# Patient Record
Sex: Male | Born: 1955 | Race: White | Hispanic: No | Marital: Single
Health system: Southern US, Community
[De-identification: ages and names within clinical notes are randomized; demographics above are authoritative.]

---

## 2014-01-17 ENCOUNTER — Other Ambulatory Visit (HOSPITAL_COMMUNITY)
Admission: RE | Admit: 2014-01-17 | Disposition: A | Discharge status: Home / Self Care | Payer: Medicaid Other | Source: Ambulatory Visit | Attending: Oncology | Admitting: Oncology

## 2016-01-27 ENCOUNTER — Other Ambulatory Visit (HOSPITAL_COMMUNITY): Payer: Medicaid Other

## 2016-01-27 ENCOUNTER — Inpatient Hospital Stay
Admission: AD | Admit: 2016-01-27 | Discharge: 2016-02-05 | Disposition: A | Discharge status: Skilled Nursing Facility | Payer: Medicaid Other | Source: Ambulatory Visit | Attending: Internal Medicine | Admitting: Internal Medicine

## 2016-01-27 DIAGNOSIS — Z4659 Encounter for fitting and adjustment of other gastrointestinal appliance and device: Secondary | ICD-10-CM

## 2016-01-27 DIAGNOSIS — R1310 Dysphagia, unspecified: Secondary | ICD-10-CM

## 2016-01-27 DIAGNOSIS — J9690 Respiratory failure, unspecified, unspecified whether with hypoxia or hypercapnia: Secondary | ICD-10-CM

## 2016-01-27 LAB — BASIC METABOLIC PANEL
ANION GAP: 11 (ref 5–15)
BUN: 14 mg/dL (ref 6–20)
CO2: 26 mmol/L (ref 22–32)
Calcium: 9.2 mg/dL (ref 8.9–10.3)
Chloride: 99 mmol/L — ABNORMAL LOW (ref 101–111)
Creatinine, Ser: 0.87 mg/dL (ref 0.61–1.24)
GFR calc Af Amer: >60 mL/min (ref >60)
Glucose, Bld: 139 mg/dL — ABNORMAL HIGH (ref 65–99)
Potassium: 4.6 mmol/L (ref 3.5–5.1)
SODIUM: 136 mmol/L (ref 135–145)

## 2016-01-27 LAB — CBC
HCT: 31.9 % — ABNORMAL LOW (ref 39.0–52.0)
Hemoglobin: 10.1 g/dL — ABNORMAL LOW (ref 13.0–17.0)
MCH: 31.4 pg (ref 26.0–34.0)
MCHC: 31.7 g/dL (ref 30.0–36.0)
MCV: 99.1 fL (ref 78.0–100.0)
PLATELETS: 516 10*3/uL — AB (ref 150–400)
RBC: 3.22 MIL/uL — AB (ref 4.22–5.81)
RDW: 14.8 % (ref 11.5–15.5)
WBC: 11.6 10*3/uL — AB (ref 4.0–10.5)

## 2016-01-28 ENCOUNTER — Other Ambulatory Visit (HOSPITAL_COMMUNITY): Payer: Medicaid Other

## 2016-01-28 LAB — APTT: aPTT: 28 seconds (ref 24–37)

## 2016-01-28 LAB — PROTIME-INR
INR: 1.08 (ref 0.00–1.49)
Prothrombin Time: 14.2 seconds (ref 11.6–15.2)

## 2016-01-28 NOTE — Consult Note (Signed)
Chief Complaint: Patient was seen in consultation today for percutaneous gastric tube placement at the request of Dr Sharyon Medicus  Referring Physician(s): [facility]  Supervising Physician 01/28/2016 Billy Wiley  History of Present Illness: Billy Wiley is a 60 y.o. 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  No past surgical history on file.  Allergies: Review of patient's allergies indicates not on file.  Medications: Prior to Admission medications   Not on File     No family history on file.  Social History   Social History  . Marital Status: Unknown    Spouse Name: N/A  . Number of Children: N/A  . Years of Education: N/A   Social History Main Topics  . Smoking status: Not on file  . Smokeless tobacco: Not on file  . Alcohol Use: Not on file  . Drug Use: Not on file  . Sexual Activity: Not on file   Other Topics Concern  . Not on file   Social History Narrative  . No narrative on file     Review of Systems: A 12 point ROS discussed and pertinent positives are indicated in the HPI above.  All other systems are negative.  Review of Systems  Constitutional: Positive for activity change and fatigue. Negative for fever and appetite change.  Respiratory: Negative for shortness of breath.   Gastrointestinal: Negative for abdominal pain.  Neurological: Positive for weakness.  Psychiatric/Behavioral: Positive for confusion.    Vital Signs: There were no vitals taken for this visit.  Physical Exam  Cardiovascular: Normal rate and regular rhythm.   Pulmonary/Chest: Effort normal and breath sounds normal.  Abdominal: Soft. Bowel sounds are normal.  Musculoskeletal: Normal  range of motion.  Neurological: He is alert.  Skin: Skin is warm and dry.  Psychiatric:  Consented dtr over phone  Nursing note and vitals reviewed.   Mallampati Score:  MD Evaluation Airway: WNL Heart: WNL Abdomen: WNL Chest/ Lungs: WNL ASA  Classification: 3 Mallampati/Airway Score: One  Imaging: Ct Abdomen Wo Contrast  01/27/2016  CLINICAL DATA:  Gastrostomy tube evaluation. EXAM: CT ABDOMEN WITHOUT CONTRAST TECHNIQUE: Multidetector CT imaging of the abdomen was performed following the standard protocol without IV contrast. COMPARISON:  None. FINDINGS: Lower chest:  No acute findings. Hepatobiliary: No mass visualized on this un-enhanced exam. Tiny calcified gallstones noted, however there is no evidence cholecystitis or biliary ductal dilatation. Pancreas: No mass or inflammatory process identified on this un-enhanced exam. Spleen: Within normal limits in size. Adrenals/Urinary Tract: Adrenal glands are unremarkable. Tiny 1-2 mm nonobstructive calculi noted in both kidneys. No evidence of hydronephrosis. Stomach/Bowel: No evidence of obstruction, inflammatory process, or abnormal fluid collections. No evidence of interposition of transverse colon anterior to the stomach. Vascular/Lymphatic: No pathologically enlarged lymph nodes. No evidence of abdominal aortic aneurysm. Aortic atherosclerotic plaque noted. Other: A tiny periumbilical hernia seen containing only fat. This is incompletely visualized on this exam. Musculoskeletal:  No suspicious bone lesions identified. IMPRESSION: No evidence of colonic interposition anterior to stomach. Cholelithiasis.  No radiographic evidence of cholecystitis. Tiny nonobstructive bilateral renal calculi. Electronically Signed   By: Myles Rosenthal M.D.   On: 01/27/2016 18:03   Dg Abd 1 View  01/27/2016  CLINICAL DATA:  Feeding tube placement. EXAM: ABDOMEN - 1 VIEW COMPARISON:  None. FINDINGS: Feeding tube is in place with the tip in the distal stomach  directed toward the duodenum. IMPRESSION: As above. Electronically Signed   By: Drusilla Kanner M.D.   On: 01/27/2016 19:57   Dg Chest Port 1 View  01/28/2016  CLINICAL DATA:  CHF and sepsis.  Respiratory failure EXAM: PORTABLE CHEST 1 VIEW COMPARISON:  None. FINDINGS: COPD with hyperinflation of the lungs. Apical scarring and bleb formation on the right. Negative for heart failure. Negative for pneumonia. Mild right lower lobe atelectasis. No pleural effusion. Feeding tube enters the stomach with the tip not visualized. IMPRESSION: COPD with slight right lower lobe atelectasis. Otherwise no acute abnormality. Electronically Signed   By: Marlan Palau M.D.   On: 01/28/2016 07:21   Dg Abd Portable 1v  01/27/2016  CLINICAL DATA:  Nasogastric tube placement. EXAM: PORTABLE ABDOMEN - 1 VIEW COMPARISON:  None. FINDINGS: Tip and side port of the enteric tube below the diaphragm in the stomach. No evidence of bowel obstruction. There is air throughout normal caliber colon. IMPRESSION: Tip and side port of the enteric tube below the diaphragm in the stomach. Electronically Signed   By: Rubye Oaks M.D.   On: 01/27/2016 02:14    Labs:  CBC:  Recent Labs  01/27/16 0901  WBC 11.6*  HGB 10.1*  HCT 31.9*  PLT 516*    COAGS:  Recent Labs  01/28/16 0952  INR 1.08  APTT 28    BMP:  Recent Labs  01/27/16 0901  NA 136  K 4.6  CL 99*  CO2 26  GLUCOSE 139*  BUN 14  CALCIUM 9.2  CREATININE 0.87  GFRNONAA >60  GFRAA >60    LIVER FUNCTION TESTS: No results for input(s): BILITOT, AST, ALT, ALKPHOS, PROT, ALBUMIN in the last 8760 hours.  TUMOR MARKERS: No results for input(s): AFPTM, CEA, CA199, CHROMGRNA in the last 8760 hours.  Assessment and Plan:  Parkinsons dz Aspiration PCM; dysphagia AMS Need for long term care Scheduled for percutaneous gastric tube placement in IR 2/24 Risks and Benefits discussed with the patient's daughter including, but not limited to the need  for a barium enema during the procedure, bleeding, infection, peritonitis, or damage to adjacent structures. All of her questions were answered, she is agreeable to proceed. Consent signed and in chart.   Thank you for this interesting consult.  I greatly enjoyed meeting Billy Wiley and look forward to participating in their care.  A copy of this report was sent to the requesting provider on this date.  Electronically Signed: Ralene Muskrat A 01/28/2016, 3:52 PM   I spent a total of 40 Minutes    in face to face in clinical consultation, greater than 50% of which was counseling/coordinating care for percutaneous gastric tube placement

## 2016-01-29 ENCOUNTER — Other Ambulatory Visit (HOSPITAL_COMMUNITY): Payer: Medicaid Other

## 2016-01-29 MED ORDER — SODIUM CHLORIDE 0.9 % IV SOLN
INTRAVENOUS | Status: AC | PRN
  Administered 2016-01-29: 10 mL/h via INTRAVENOUS

## 2016-01-29 MED ORDER — FENTANYL CITRATE (PF) 100 MCG/2ML IJ SOLN
INTRAMUSCULAR | Status: AC
  Filled 2016-01-29: qty 2

## 2016-01-29 MED ORDER — IOHEXOL 300 MG/ML  SOLN
50.0000 mL | Freq: Once | INTRAMUSCULAR | Status: AC | PRN
  Administered 2016-01-29: 50 mL via INTRAVENOUS

## 2016-01-29 MED ORDER — LIDOCAINE HCL 1 % IJ SOLN
INTRAMUSCULAR | Status: AC
  Filled 2016-01-29: qty 20

## 2016-01-29 MED ORDER — FENTANYL CITRATE (PF) 100 MCG/2ML IJ SOLN
INTRAMUSCULAR | Status: AC | PRN
  Administered 2016-01-29: 50 ug via INTRAVENOUS
  Administered 2016-01-29: 25 ug via INTRAVENOUS

## 2016-01-29 MED ORDER — GLUCAGON HCL (RDNA) 1 MG IJ SOLR
INTRAMUSCULAR | Status: AC | PRN
  Administered 2016-01-29: 1 mg via INTRAVENOUS

## 2016-01-29 MED ORDER — CEFAZOLIN SODIUM-DEXTROSE 2-3 GM-% IV SOLR
2.0000 g | Freq: Once | INTRAVENOUS | Status: AC
  Administered 2016-01-29: 2 g via INTRAVENOUS

## 2016-01-29 MED ORDER — GLUCAGON HCL RDNA (DIAGNOSTIC) 1 MG IJ SOLR
INTRAMUSCULAR | Status: AC
  Filled 2016-01-29: qty 1

## 2016-01-29 MED ORDER — MIDAZOLAM HCL 2 MG/2ML IJ SOLN
INTRAMUSCULAR | Status: AC
  Filled 2016-01-29: qty 2

## 2016-01-29 MED ORDER — CEFAZOLIN SODIUM-DEXTROSE 2-3 GM-% IV SOLR
INTRAVENOUS | Status: AC
  Administered 2016-01-29: 2 g via INTRAVENOUS
  Filled 2016-01-29: qty 50

## 2016-01-29 MED ORDER — MIDAZOLAM HCL 2 MG/2ML IJ SOLN
INTRAMUSCULAR | Status: AC | PRN
  Administered 2016-01-29: 0.5 mg via INTRAVENOUS
  Administered 2016-01-29: 1 mg via INTRAVENOUS

## 2016-01-29 NOTE — Procedures (Signed)
G tube 20 Fr pull through No comp/EBL 

## 2016-02-01 LAB — CBC WITH DIFFERENTIAL/PLATELET
BASOS ABS: 0 10*3/uL (ref 0.0–0.1)
BASOS PCT: 0 %
Eosinophils Absolute: 0.1 10*3/uL (ref 0.0–0.7)
Eosinophils Relative: 1 %
HEMATOCRIT: 34.3 % — AB (ref 39.0–52.0)
Hemoglobin: 11.4 g/dL — ABNORMAL LOW (ref 13.0–17.0)
Lymphocytes Relative: 40 %
Lymphs Abs: 4 10*3/uL (ref 0.7–4.0)
MCH: 32.8 pg (ref 26.0–34.0)
MCHC: 33.2 g/dL (ref 30.0–36.0)
MCV: 98.6 fL (ref 78.0–100.0)
MONO ABS: 1.5 10*3/uL — AB (ref 0.1–1.0)
Monocytes Relative: 15 %
NEUTROS ABS: 4.3 10*3/uL (ref 1.7–7.7)
NEUTROS PCT: 44 %
Platelets: 490 10*3/uL — ABNORMAL HIGH (ref 150–400)
RBC: 3.48 MIL/uL — AB (ref 4.22–5.81)
RDW: 14.5 % (ref 11.5–15.5)
WBC: 9.9 10*3/uL (ref 4.0–10.5)

## 2016-02-01 LAB — BASIC METABOLIC PANEL
ANION GAP: 11 (ref 5–15)
BUN: 14 mg/dL (ref 6–20)
CALCIUM: 9.2 mg/dL (ref 8.9–10.3)
CO2: 27 mmol/L (ref 22–32)
Chloride: 101 mmol/L (ref 101–111)
Creatinine, Ser: 0.85 mg/dL (ref 0.61–1.24)
Glucose, Bld: 152 mg/dL — ABNORMAL HIGH (ref 65–99)
Potassium: 3.8 mmol/L (ref 3.5–5.1)
Sodium: 139 mmol/L (ref 135–145)

## 2016-02-01 LAB — MAGNESIUM: Magnesium: 1.6 mg/dL — ABNORMAL LOW (ref 1.7–2.4)

## 2016-02-01 LAB — PHOSPHORUS: PHOSPHORUS: 4.5 mg/dL (ref 2.5–4.6)

## 2016-02-03 ENCOUNTER — Other Ambulatory Visit (HOSPITAL_COMMUNITY): Payer: Medicaid Other

## 2016-02-03 LAB — C DIFFICILE QUICK SCREEN W PCR REFLEX
C Diff antigen: NEGATIVE
C Diff interpretation: NEGATIVE
C Diff toxin: NEGATIVE

## 2016-03-07 ENCOUNTER — Other Ambulatory Visit (HOSPITAL_COMMUNITY): Payer: Self-pay | Admitting: Adult Health

## 2016-03-07 DIAGNOSIS — R1310 Dysphagia, unspecified: Secondary | ICD-10-CM

## 2016-03-09 ENCOUNTER — Inpatient Hospital Stay (HOSPITAL_COMMUNITY): Admission: RE | Admit: 2016-03-09 | Payer: Medicaid Other | Source: Ambulatory Visit

## 2016-03-29 ENCOUNTER — Inpatient Hospital Stay (HOSPITAL_COMMUNITY): Admission: RE | Admit: 2016-03-29 | Payer: Medicaid Other | Source: Ambulatory Visit

## 2017-05-03 ENCOUNTER — Other Ambulatory Visit (HOSPITAL_COMMUNITY): Payer: Self-pay | Admitting: Radiology

## 2017-05-03 DIAGNOSIS — R633 Feeding difficulties, unspecified: Secondary | ICD-10-CM

## 2017-05-04 ENCOUNTER — Encounter (HOSPITAL_COMMUNITY): Payer: Self-pay | Admitting: Diagnostic Radiology

## 2017-05-04 ENCOUNTER — Ambulatory Visit (HOSPITAL_COMMUNITY)
Admission: RE | Admit: 2017-05-04 | Discharge: 2017-05-04 | Disposition: A | Discharge status: Home / Self Care | Payer: Medicare Other | Source: Ambulatory Visit | Attending: Radiology | Admitting: Radiology

## 2017-05-04 ENCOUNTER — Other Ambulatory Visit (HOSPITAL_COMMUNITY): Payer: Self-pay | Admitting: Radiology

## 2017-05-04 DIAGNOSIS — R633 Feeding difficulties, unspecified: Secondary | ICD-10-CM

## 2017-05-04 DIAGNOSIS — K9423 Gastrostomy malfunction: Secondary | ICD-10-CM | POA: Insufficient documentation

## 2017-05-04 HISTORY — PX: IR CM INJ ANY COLONIC TUBE W/FLUORO: IMG2336

## 2017-05-04 MED ORDER — LIDOCAINE VISCOUS 2 % MT SOLN
OROMUCOSAL | Status: AC
  Filled 2017-05-04: qty 15

## 2017-05-04 MED ORDER — IOPAMIDOL (ISOVUE-300) INJECTION 61%
INTRAVENOUS | Status: AC
  Filled 2017-05-04: qty 50

## 2017-05-04 MED ORDER — IOPAMIDOL (ISOVUE-300) INJECTION 61%
50.0000 mL | Freq: Once | INTRAVENOUS | Status: AC | PRN
  Administered 2017-05-04: 10 mL via INTRAVENOUS

## 2017-08-22 ENCOUNTER — Other Ambulatory Visit (HOSPITAL_COMMUNITY): Payer: Self-pay | Admitting: Internal Medicine

## 2017-08-22 DIAGNOSIS — R633 Feeding difficulties, unspecified: Secondary | ICD-10-CM

## 2017-08-24 ENCOUNTER — Ambulatory Visit (HOSPITAL_COMMUNITY)
Admission: RE | Admit: 2017-08-24 | Discharge: 2017-08-24 | Disposition: A | Discharge status: Home / Self Care | Payer: Medicare Other | Source: Ambulatory Visit | Attending: Internal Medicine | Admitting: Internal Medicine

## 2017-08-24 ENCOUNTER — Encounter (HOSPITAL_COMMUNITY): Payer: Self-pay | Admitting: Interventional Radiology

## 2017-08-24 ENCOUNTER — Ambulatory Visit (HOSPITAL_COMMUNITY)
Admission: RE | Admit: 2017-08-24 | Discharge: 2017-08-24 | Disposition: A | Discharge status: Home / Self Care | Payer: Medicare Other | Source: Ambulatory Visit | Attending: Interventional Radiology | Admitting: Interventional Radiology

## 2017-08-24 DIAGNOSIS — K9420 Gastrostomy complication, unspecified: Secondary | ICD-10-CM | POA: Diagnosis present

## 2017-08-24 DIAGNOSIS — M795 Residual foreign body in soft tissue: Secondary | ICD-10-CM

## 2017-08-24 DIAGNOSIS — R633 Feeding difficulties, unspecified: Secondary | ICD-10-CM

## 2017-08-24 DIAGNOSIS — R1319 Other dysphagia: Secondary | ICD-10-CM | POA: Diagnosis not present

## 2017-08-24 HISTORY — PX: IR REPLC GASTRO/COLONIC TUBE PERCUT W/FLUORO: IMG2333

## 2017-08-24 MED ORDER — IOPAMIDOL (ISOVUE-300) INJECTION 61%
INTRAVENOUS | Status: AC
  Filled 2017-08-24: qty 50

## 2017-08-24 MED ORDER — LIDOCAINE VISCOUS 2 % MT SOLN
OROMUCOSAL | Status: DC | PRN
  Administered 2017-08-24: 5 mL via OROMUCOSAL

## 2017-08-24 MED ORDER — LIDOCAINE VISCOUS 2 % MT SOLN
OROMUCOSAL | Status: AC
  Filled 2017-08-24: qty 15

## 2017-08-24 NOTE — Procedures (Signed)
Interventional Radiology Procedure Note  Procedure: Removal of damaged 28F pull-through g-tube.  Placement of new 28F balloon retention.    Findings:  Fracture of the damaged 28F pull-through, with retained fragment of the button.  Possible location within the body wall or outside stomach lumen.   .  Complications: None Recommendations:  - To CT for non-contrast CT abd - OK to use g-tube.  - Routine g-tube care    Signed,  Yvone Neu. Loreta Ave, DO

## 2017-10-13 IMAGING — XA IR GI TUBE CONTRAST INJECTION
1 series · 3 of 3 positions shown · non-contrast
Comparison: none

INDICATION: Leaking gastrostomy tube.

[Series 300: tube placements · 3 of 3 slices shown]
[im 1/3]
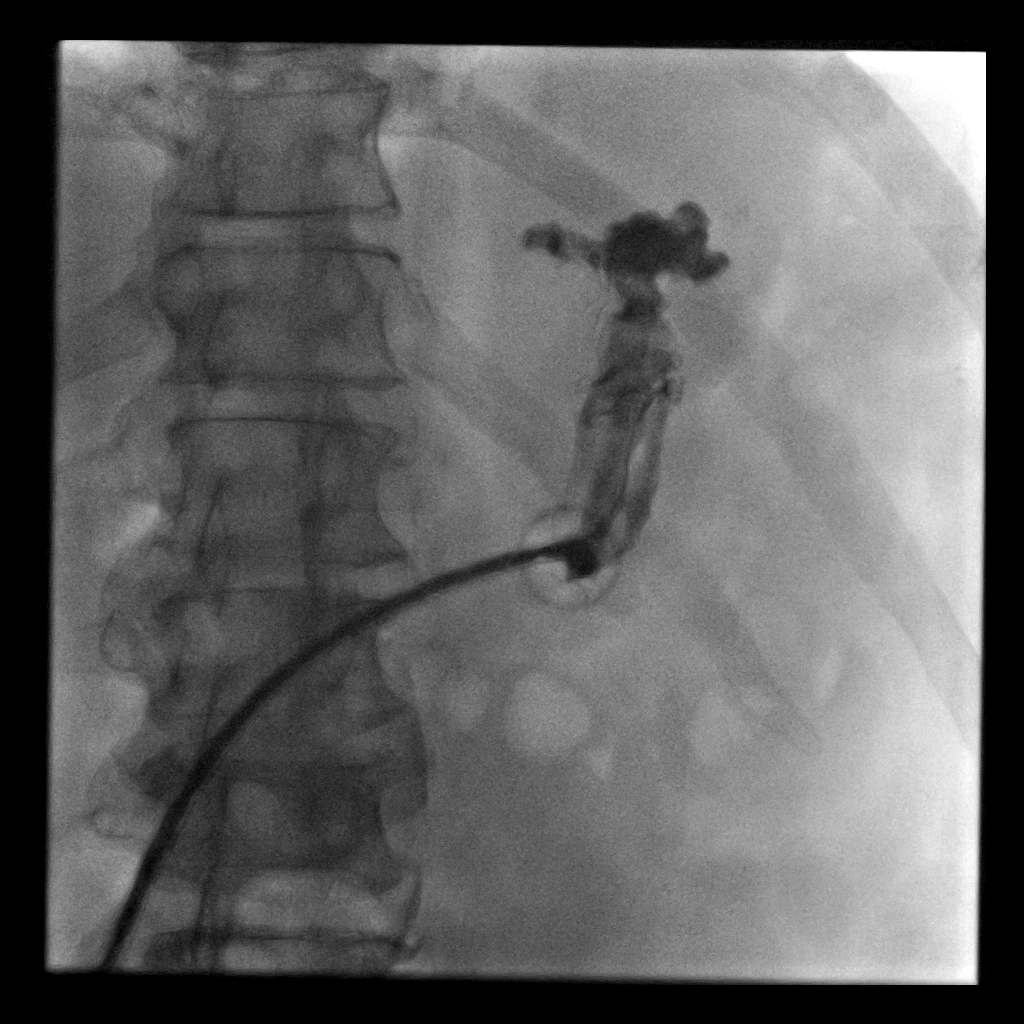
[im 2/3]
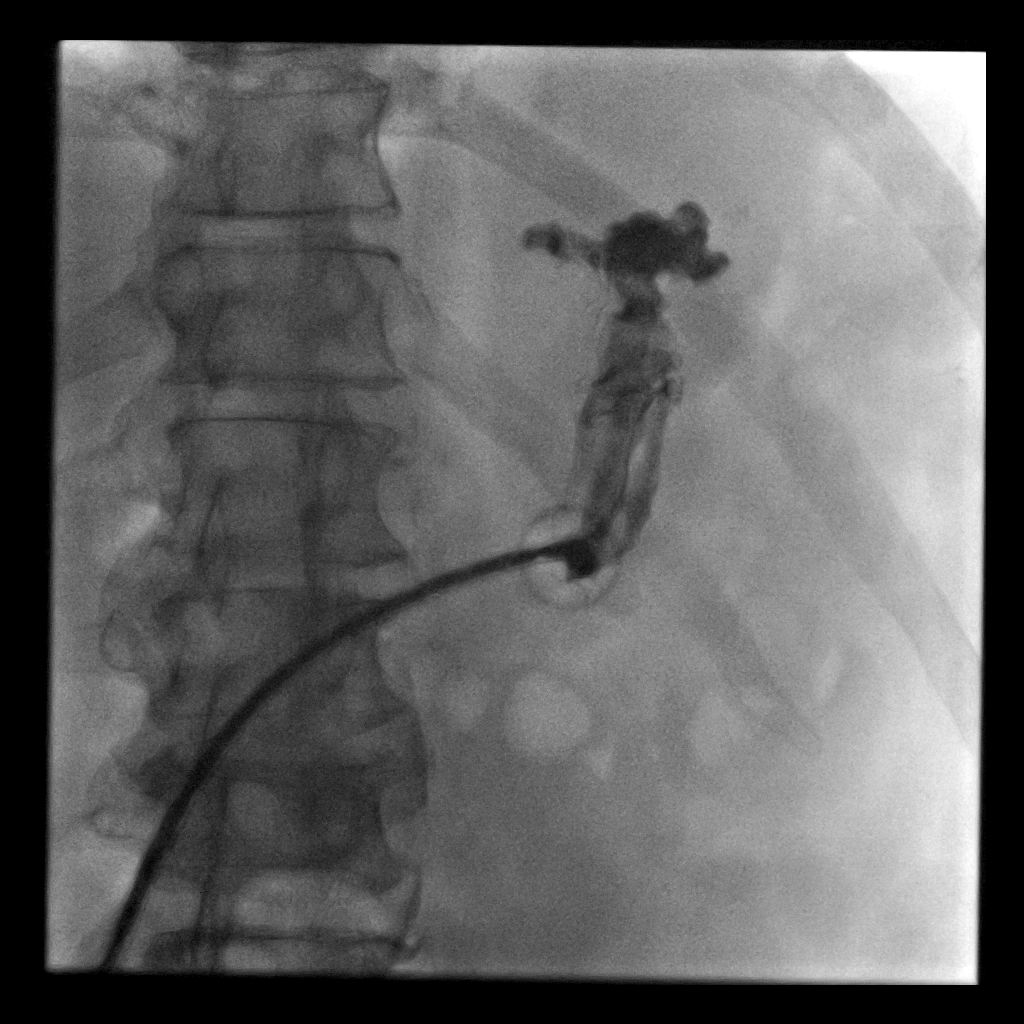
[im 3/3]
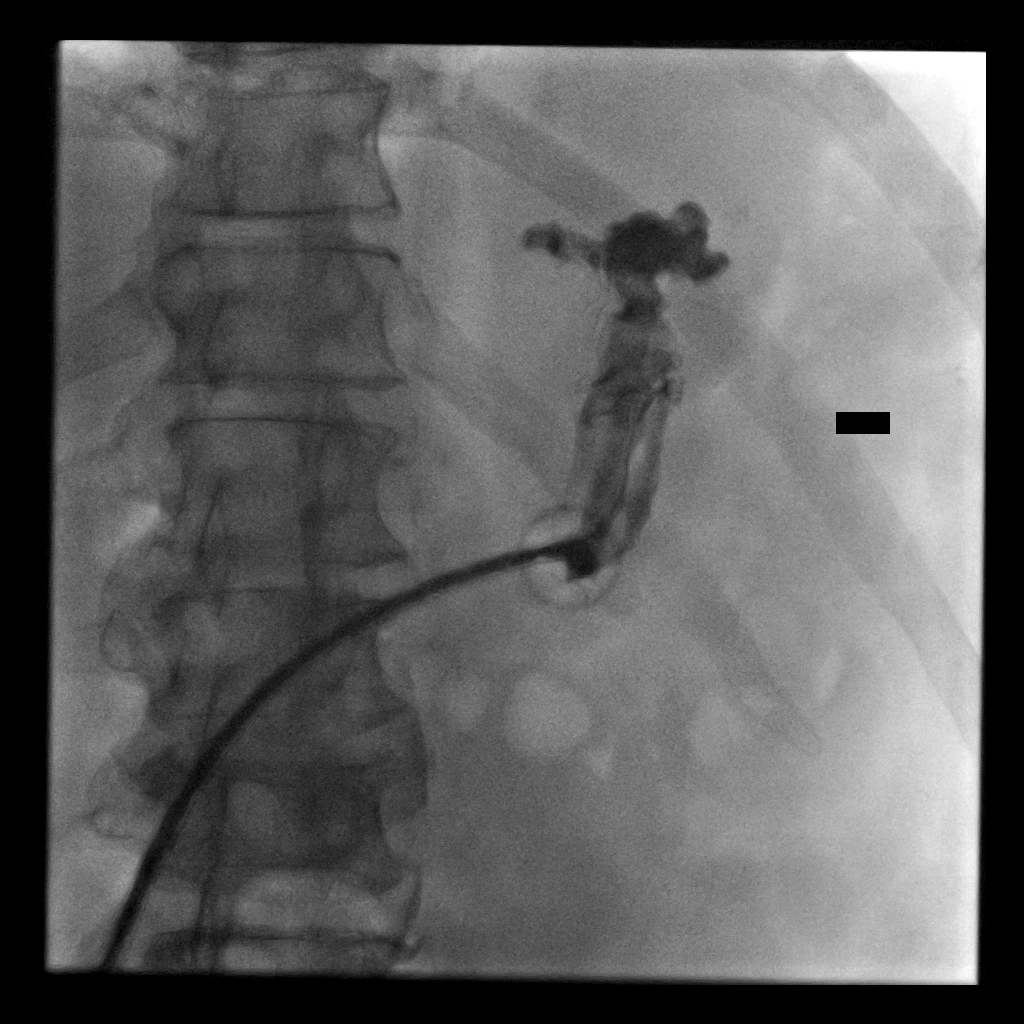

[3 of 3 positions shown; findings below may reference images not displayed]

EXAM:
GASTROSTOMY TUBE INJECTION WITH FLUOROSCOPY

MEDICATIONS:
None

ANESTHESIA/SEDATION:
None

CONTRAST:  10 mL [id number] - administered into the gastric lumen.

FLUOROSCOPY TIME:  Fluoroscopy Time: 6 seconds, 1 mGy

COMPLICATIONS:
None immediate.

PROCEDURE:
The gastrostomy tube was evaluated. The end of the tube was
fractured. The tube was cut at a normal segment of the tube. A new
adapter device was placed. The tube flushed without problems.
Contrast injection confirmed placement in the stomach.

Fluoroscopic images were taken and saved for this procedure.
IMPRESSION: Successful repair of the gastrostomy tube.

Gastrostomy tube is well positioned in the stomach. Gastrostomy tube
is ready to be used.

## 2018-02-02 IMAGING — CT CT ABDOMEN W/O CM
2 of 4 series · 16 of 46 positions shown, 18 images · non-contrast
Comparison: 01/27/2016

CLINICAL DATA: 60-year-old male with a history of percutaneous
gastrostomy tube. The gastrostomy has been recently exchanged, with
fracture of the catheter end retained foreign body. Concern for
retained foreign body within the abdomen.

EXAM:
CT ABDOMEN WITHOUT CONTRAST
TECHNIQUE: Multidetector CT imaging of the abdomen was performed following the
standard protocol without IV contrast.

[Series 3: a/p w/o 5mm · axial · non-contrast · 0.71mm/px · z∈[+1143,+1368]mm · 13 of 51 slices shown, 15 images]
[im 3/51  soft-tissue]
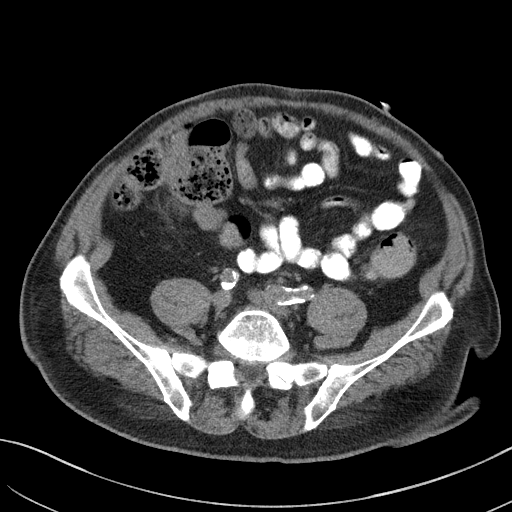
[im 3/51  bone]
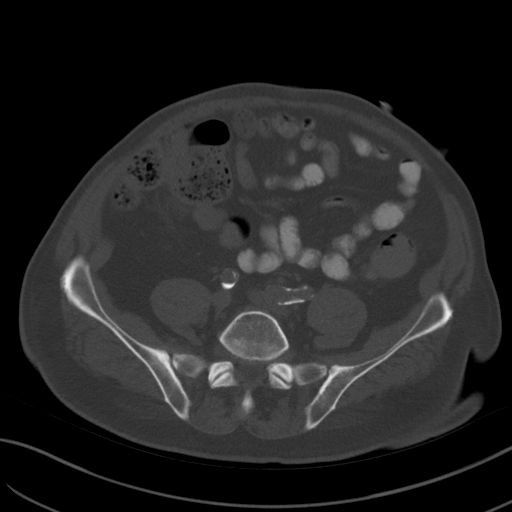
[im 7/51  soft-tissue]
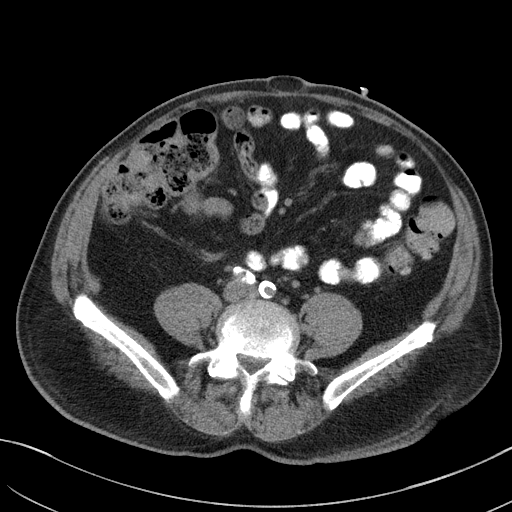
[im 11/51  soft-tissue]
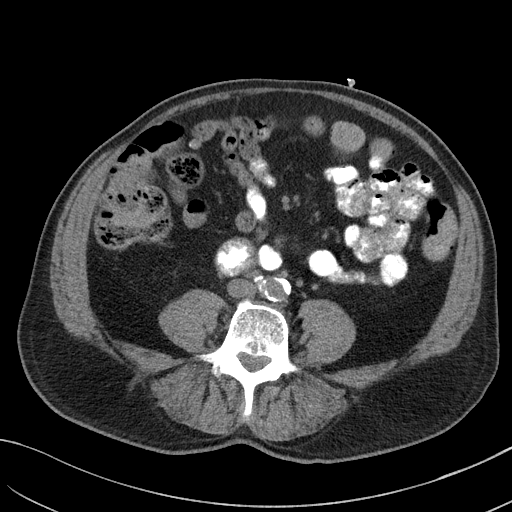
[im 15/51  soft-tissue]
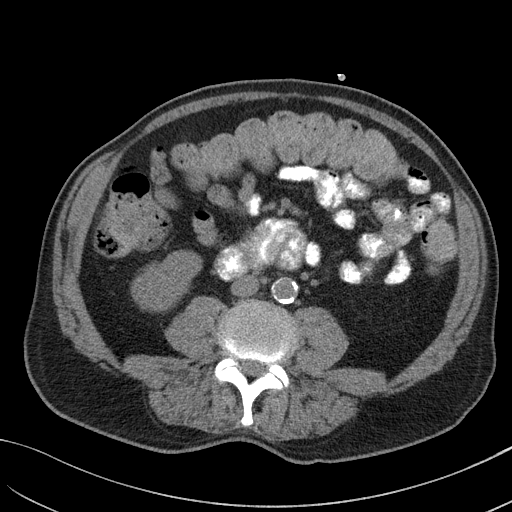
[im 17/51  soft-tissue]
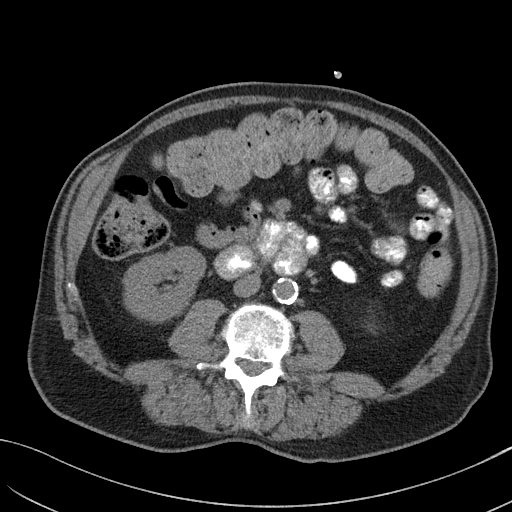
[im 21/51  soft-tissue]
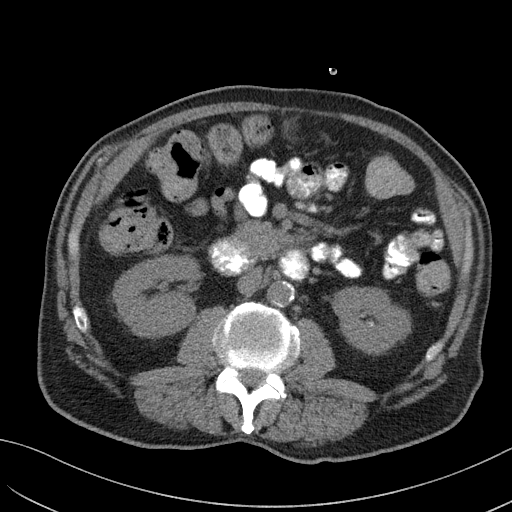
[im 26/51  soft-tissue]
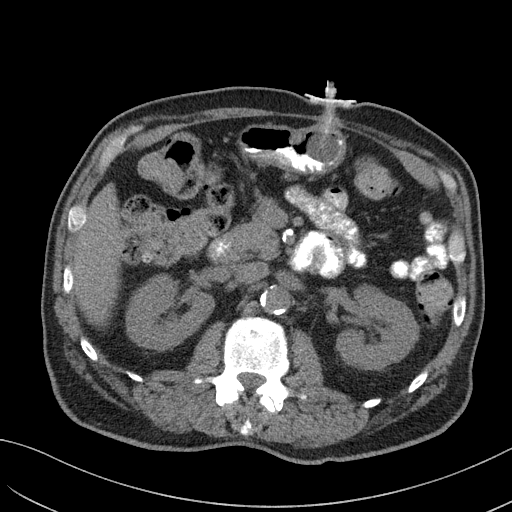
[im 30/51  soft-tissue]
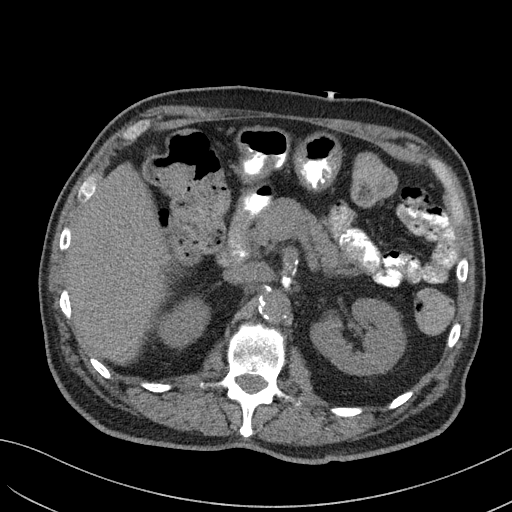
[im 34/51  soft-tissue]
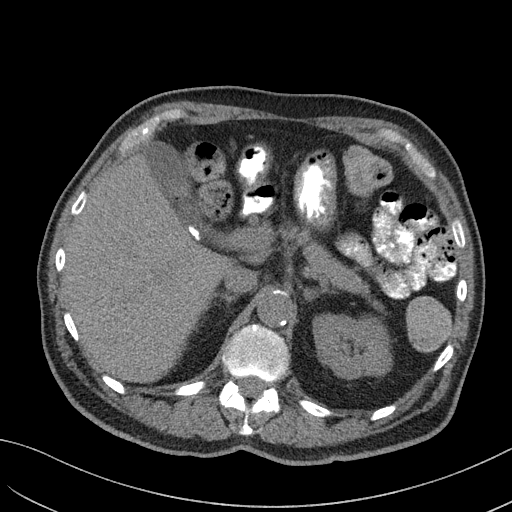
[im 34/51  bone]
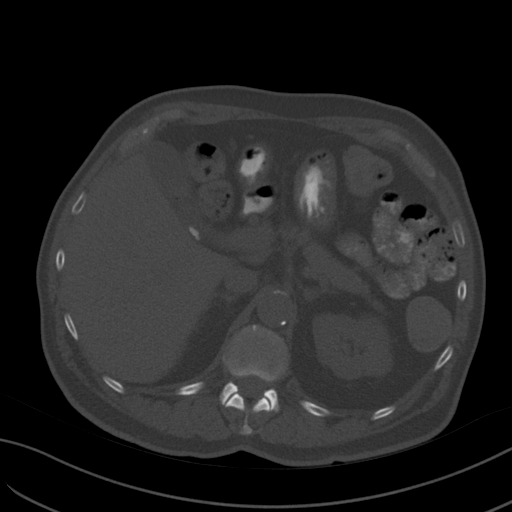
[im 36/51  soft-tissue]
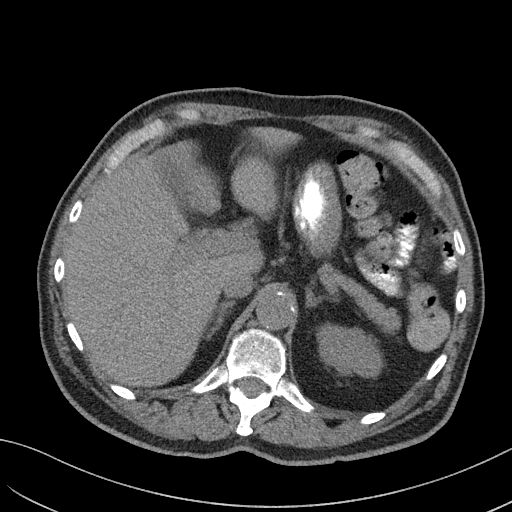
[im 40/51  soft-tissue]
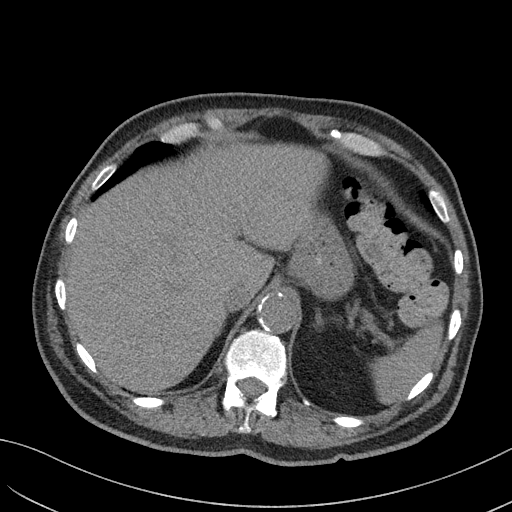
[im 44/51  soft-tissue]
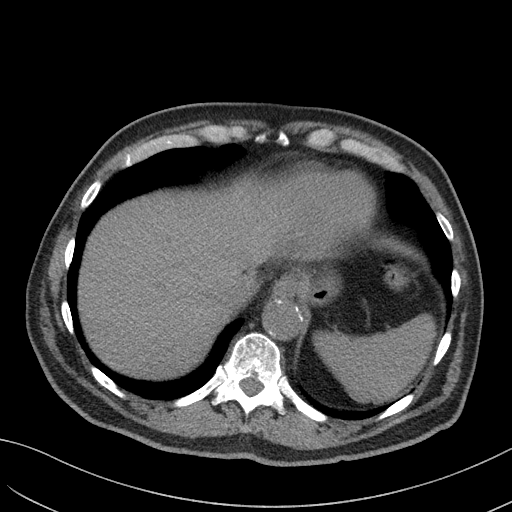
[im 48/51  soft-tissue]
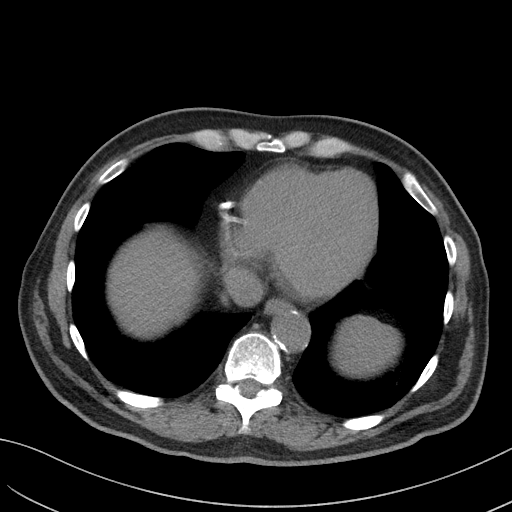

[Series 6: a/p w/o cor · coronal · non-contrast · 0.50mm/px · 3 of 140 slices shown]
[im 47/140  soft-tissue]
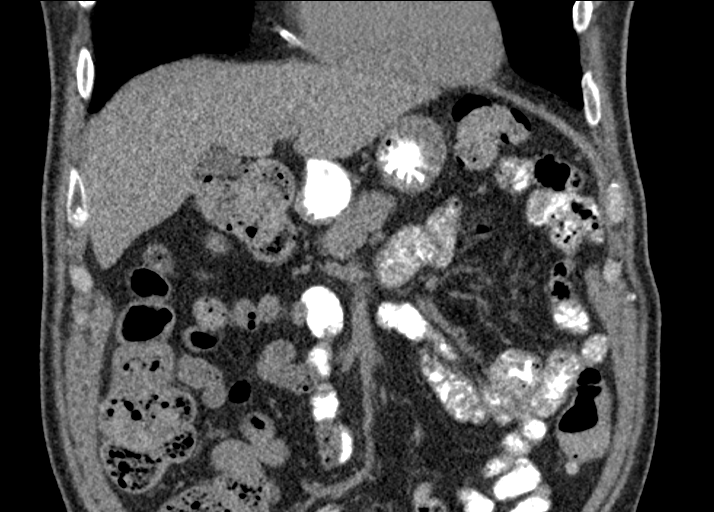
[im 62/140  soft-tissue]
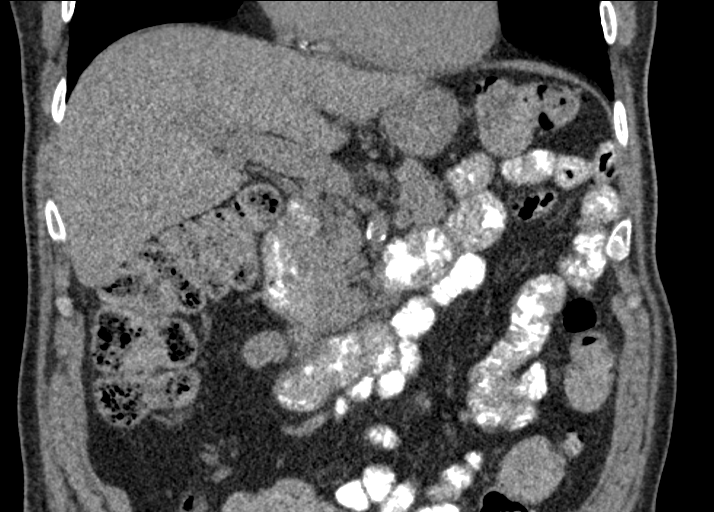
[im 78/140  soft-tissue]
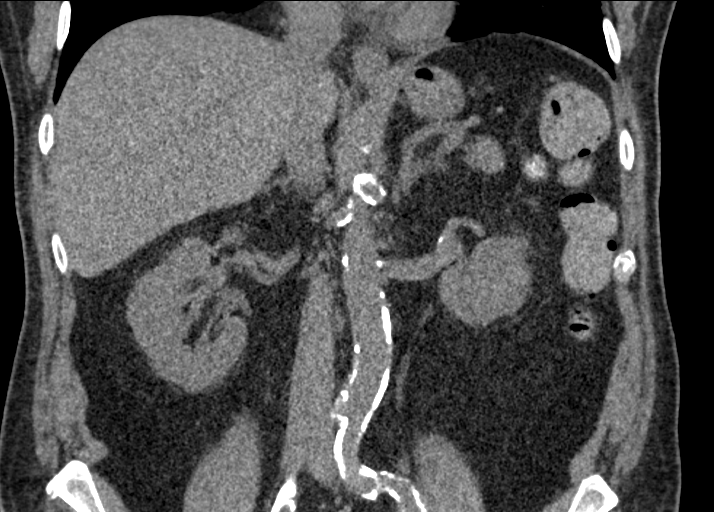

[16 of 46 positions shown; findings below may reference images not displayed]

FINDINGS: Lower chest: No acute abnormality.

Hepatobiliary: Unremarkable appearance of liver. Cholelithiasis
without acute inflammatory changes.

Pancreas: Unremarkable. No pancreatic ductal dilatation or
surrounding inflammatory changes.

Spleen: Normal in size without focal abnormality.

Adrenals/Urinary Tract: Adrenal glands are unremarkable. Visualized
kidneys unremarkable. Tiny punctate calcifications of the left
collecting system suggestive of nonobstructive calculi

Stomach/Bowel: Enteric contrast within stomach lumen and small
bowel. Balloon retention percutaneous gastrostomy tube appropriately
position. The fractured retention hub of the removed percutaneous
gastrostomy tube is not visualized.

Vascular/Lymphatic: Calcifications of the abdominal aorta.

Other: No foreign body identified within the abdomen.

Musculoskeletal: Degenerative changes. Compression fractures of L2
on L3 with no retropulsion of fracture fragments.
IMPRESSION: No retained foreign body identified within the peritoneum more
adjacent to the stomach. Presumably the fractured rubber hub of the
removed gastrostomy is within the lumen of small bowel.

Nonobstructive bowel gas pattern.

Nonobstructive nephrolithiasis.

Cholelithiasis.

## 2023-01-05 DEATH — deceased
